# Patient Record
Sex: Male | Born: 2000 | Race: Black or African American | Hispanic: No | Marital: Single | Smoking: Never smoker
Health system: Southern US, Community
[De-identification: ages and names within clinical notes are randomized; demographics above are authoritative.]

## PROBLEM LIST (undated history)

## (undated) DIAGNOSIS — F319 Bipolar disorder, unspecified: Secondary | ICD-10-CM

## (undated) DIAGNOSIS — I10 Essential (primary) hypertension: Secondary | ICD-10-CM

## (undated) DIAGNOSIS — F909 Attention-deficit hyperactivity disorder, unspecified type: Secondary | ICD-10-CM

## (undated) DIAGNOSIS — E1165 Type 2 diabetes mellitus with hyperglycemia: Secondary | ICD-10-CM

## (undated) DIAGNOSIS — IMO0002 Reserved for concepts with insufficient information to code with codable children: Secondary | ICD-10-CM

## (undated) HISTORY — PX: TIBIA FRACTURE SURGERY: SHX806

## (undated) HISTORY — PX: EYE SURGERY: SHX253

---
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
## 2021-01-14 ENCOUNTER — Ambulatory Visit (HOSPITAL_COMMUNITY)
Admission: EM | Admit: 2021-01-14 | Discharge: 2021-01-14 | Disposition: A | Discharge status: Home / Self Care | Payer: Medicaid Other | Attending: Psychiatry | Admitting: Psychiatry

## 2021-01-14 DIAGNOSIS — Z91128 Patient's intentional underdosing of medication regimen for other reason: Secondary | ICD-10-CM | POA: Insufficient documentation

## 2021-01-14 DIAGNOSIS — F319 Bipolar disorder, unspecified: Secondary | ICD-10-CM | POA: Insufficient documentation

## 2021-01-14 DIAGNOSIS — Z9151 Personal history of suicidal behavior: Secondary | ICD-10-CM | POA: Insufficient documentation

## 2021-01-14 DIAGNOSIS — F209 Schizophrenia, unspecified: Secondary | ICD-10-CM | POA: Insufficient documentation

## 2021-01-14 DIAGNOSIS — R45851 Suicidal ideations: Secondary | ICD-10-CM

## 2021-01-14 NOTE — ED Provider Notes (Signed)
Behavioral Health Urgent Care Medical Screening Exam  Patient Name: Darryl [name] MRN: [id number] Date of Evaluation: 01/14/21 Chief Complaint:  SI Diagnosis:  Final diagnoses:  Suicidal ideation    History of Present illness: Darryl [name] is a 20 y.o. male patient who presents to the [facility] via [employer].  Patient seen and evaluated face-to-face by this provider, and chart reviewed. On evaluation, patient is alert and oriented x4. His thought process is logical and speech is clear and coherent. He appears casually dressed.  Patient reports that he called the police and told them he was suicidal after his friend kicked him out.  He states that his friend came and got him last night to spend the night in [place]. He states that this morning he asked his friend to take him back home to [place] and his friend said no. He states that they began arguing and his friend kicked him out. He states that he is not suicidal and that he needs a ride back home to [place]. He states that he resides with his uncle, aunt and cousin.   Patient denies suicidal ideations and verbally contracts for safety to return home. Patient denies homicidal ideations. Patient denies auditory or visual hallucinations. There is no objective evidence that the patient is responding to internal or external stimuli. Patient reports a history of schizophrenia and bipolar. He states he's been off his medications x 1 months. He is unable to recall the name of the medications he's prescribed. Patient reports that he follows up with psychiatry at [facility] in [facility] but is unable to name the practice. Patient denies drinking alcohol or using illicit drugs.Patient reports a hx of one suicide attempt 1 year ago in [place]. Patient reports a hx of 2 psych hospitalizations in 2021 and 2019.   Patient states that he does not know his uncle's number. He states that this provider can call his  father [name] [phone] for collateral. He states that his father is hearing impaired. Patient provided with a taxi voucher home.   Psychiatric Specialty Exam  Presentation  General Appearance:Appropriate for Environment  Eye Contact:Fair  Speech:Clear and Coherent  Speech Volume:Normal  Handedness:No data recorded  Mood and Affect  Mood:Euthymic  Affect:Congruent   Thought Process  Thought Processes:Coherent  Descriptions of Associations:Intact  Orientation:Full (Time, Place and Person)  Thought Content:Logical    Hallucinations:None  Ideas of Reference:None  Suicidal Thoughts:No  Homicidal Thoughts:No   Sensorium  Memory:Immediate Fair; Recent Fair; Remote Fair  Judgment:Intact  Insight:Fair   Executive Functions  Concentration:Fair  Attention Span:Fair  Recall:Fair  Fund of Knowledge:Fair  Language:Fair   Psychomotor Activity  Psychomotor Activity:Normal   Assets  Assets:Communication Skills; Desire for Improvement; Housing; Leisure Time; Physical Health; Social Support   Sleep  Sleep:Fair  Number of hours: 6   No data recorded  Physical Exam: Physical Exam Constitutional:      Appearance: Normal appearance.  HENT:     Head: Normocephalic and atraumatic.     Nose: Nose normal.  Eyes:     Conjunctiva/sclera: Conjunctivae normal.  Cardiovascular:     Rate and Rhythm: Normal rate.  Pulmonary:     Effort: Pulmonary effort is normal.  Musculoskeletal:        General: Normal range of motion.     Cervical back: Normal range of motion.  Neurological:     Mental Status: He is alert and oriented to person, place, and time.   Review of  Systems  Constitutional: Negative.   HENT: Negative.    Eyes: Negative.   Respiratory: Negative.    Cardiovascular: Negative.   Gastrointestinal: Negative.   Genitourinary: Negative.   Musculoskeletal: Negative.   Skin: Negative.   Neurological: Negative.    Endo/Heme/Allergies: Negative.   Blood pressure 132/82, pulse 65, temperature 98.4 F (36.9 C), temperature source Oral, resp. rate 16, SpO2 100 %. There is no height or weight on file to calculate BMI.  Musculoskeletal: Strength & Muscle Tone: within normal limits Gait & Station: normal Patient leans: N/A   [facility] MSE Discharge Disposition for Follow up and Recommendations: Based on my evaluation the patient does not appear to have an emergency medical condition and can be discharged with resources and follow up care in outpatient services for Medication Management, Individual Therapy, and Group Therapy  Safety:  The patient should abstain from use of illicit substances/drugs and abuse of any medications. If symptoms worsen or do not continue to improve or if the patient becomes actively suicidal or homicidal then it is recommended that the patient return to the closest hospital emergency department, the [facility], or call 911 for further evaluation and treatment. National Suicide Prevention Lifeline 1-800-SUICIDE or (340)888-0209.  About 988 988 offers 24/7 access to trained crisis counselors who can help people experiencing mental health-related distress. People can call or text 988 or chat 988lifeline.org for themselves or if they are worried about a loved one who may need crisis support.   Crisis Services:    Lorina Rabon [place] and surrounding areas [facility] [phone] or [phone] [facility] [phone] open 24/7 [facility] [address], [place], [place] [address] [phone]) [facility] Crisis (they come to you) 3042244448 [facility] Suicide Prevention Hotline 1.6236525839 Text HOME to (269)539-6903 988 IF YOU THINK YOU MAY HURT YOURSELF OR SOMEONE ELSE,  CALL 911 IMMEDIATELY  AND GO DIRECTLY TO THE NEAREST EMERGENCY ROOM  Layla Barter,  NP 01/14/2021, 9:49 AM

## 2021-01-14 NOTE — Discharge Instructions (Addendum)
Discharge recommendations:  Patient is to take medications as prescribed. Please see information for follow-up appointment with psychiatry and therapy. Please follow up with your primary care provider for all medical related needs.    Therapy: We recommend that patient participate in individual therapy to address mental health concerns.  Medications: The parent/guardian is to contact a medical professional and/or outpatient provider to address any new side effects that develop. Parent/guardian should update outpatient providers of any new medications and/or medication changes.   Atypical antipsychotics: If you are prescribed an atypical antipsychotic, it is recommended that your height, weight, BMI, blood pressure, fasting lipid panel, and fasting blood sugar be monitored by your outpatient providers.  Safety:  The patient should abstain from use of illicit substances/drugs and abuse of any medications. If symptoms worsen or do not continue to improve or if the patient becomes actively suicidal or homicidal then it is recommended that the patient return to the closest hospital emergency department, the [facility], or call 911 for further evaluation and treatment. National Suicide Prevention Lifeline 1-800-SUICIDE or 805-307-2364.  About 988 988 offers 24/7 access to trained crisis counselors who can help people experiencing mental health-related distress. People can call or text 988 or chat 988lifeline.org for themselves or if they are worried about a loved one who may need crisis support.    Crisis Services:    Lorina Rabon [place] and surrounding areas [facility] [phone] or [phone] [facility] [phone] open 24/7 [facility] [address], [place], [place] [address] [phone]) [facility] Crisis (they come to you) [phone] [facility] Suicide Prevention  Hotline 1.(724)557-9534 Text HOME to [phone] IF YOU THINK YOU MAY HURT YOURSELF OR SOMEONE ELSE,  CALL 911 IMMEDIATELY  AND GO DIRECTLY TO THE NEAREST EMERGENCY ROOM

## 2021-01-14 NOTE — Progress Notes (Signed)
CSW provided the following crisis resources for the patient to utilize in his local area:   Lorina Rabon [place] and surrounding areas [facility] [phone] or [phone] [facility] [phone] open 24/7 [facility] [address] [place], [place] [address] [phone]) [facility] Crisis (they come to you) [phone] [facility] Suicide Prevention Hotline 1.7147552598 Text HOME to ([phone] IF YOU THINK YOU MAY HURT YOURSELF OR SOMEONE ELSE,  CALL 911 IMMEDIATELY  AND GO DIRECTLY TO THE NEAREST EMERGENCY ROOM  Crissie Reese, MSW, LCSW-A, LCAS-A Phone: [phone] Disposition/TOC

## 2021-01-14 NOTE — BH Assessment (Signed)
Darryl [name], Urgent, MR #[id number]; 20 years old presents this date with [employer].  Pt reports SI without a direct plan.  Pt reports past suicide attempt with a plan to use a knife.  Pt denies HI or AVH.  Pt admits to prior schizophrenia and bipolar diagnoses. Pt reports he is not taken medication.  Pt signed MSE.

## 2021-01-14 NOTE — Progress Notes (Signed)
   01/14/21 0835  [facility] Triage Screening (Walk-ins at [facility] only)  How Did You Hear About [place]? Legal System  What Is the Reason for Your Visit/Call Today? SI  How Long Has This Been Causing You Problems? <Week  Have You Recently Had Any Thoughts About Hurting Yourself? Yes  How long ago did you have thoughts about hurting yourself? week  Are You Planning to Commit Suicide/Harm Yourself At This time? No  Have you Recently Had Thoughts About Hurting Someone Karolee Ohs? No  Are You Planning To Harm Someone At This Time? No  Are you currently experiencing any auditory, visual or other hallucinations? No  Have You Used Any Alcohol or Drugs in the Past 24 Hours? No  Do you have any current medical co-morbidities that require immediate attention? No  Clinician description of patient physical appearance/behavior: Cooperative  What Do You Feel Would Help You the Most Today? Treatment for Depression or other mood problem  If access to [facility] was not available, would you have sought care in the Emergency Department? Yes  Determination of Need Urgent (48 hours)  Options For Referral Medication Management;Outpatient Therapy

## 2021-01-22 ENCOUNTER — Telehealth (HOSPITAL_COMMUNITY): Payer: Self-pay | Admitting: Physician Assistant

## 2021-01-22 NOTE — BH Assessment (Signed)
Care Management - Follow Up [facility] Discharges   Writer made contact with the patient. Patient reports that he needs resource information for a mental health provider in [place].  Writer provided patient with resources for services at [facility] in [place].

## 2021-04-03 ENCOUNTER — Emergency Department (HOSPITAL_COMMUNITY)
Admission: EM | Admit: 2021-04-03 | Discharge: 2021-04-03 | Disposition: A | Discharge status: Home / Self Care | Payer: Medicaid Other | Attending: Emergency Medicine | Admitting: Emergency Medicine

## 2021-04-03 ENCOUNTER — Encounter (HOSPITAL_COMMUNITY): Payer: Self-pay

## 2021-04-03 ENCOUNTER — Ambulatory Visit (HOSPITAL_COMMUNITY)
Admission: EM | Admit: 2021-04-03 | Discharge: 2021-04-03 | Disposition: A | Discharge status: Home / Self Care | Payer: Medicaid Other | Source: Home / Self Care

## 2021-04-03 ENCOUNTER — Emergency Department (HOSPITAL_COMMUNITY): Payer: Medicaid Other

## 2021-04-03 DIAGNOSIS — Z794 Long term (current) use of insulin: Secondary | ICD-10-CM | POA: Insufficient documentation

## 2021-04-03 DIAGNOSIS — E109 Type 1 diabetes mellitus without complications: Secondary | ICD-10-CM | POA: Insufficient documentation

## 2021-04-03 DIAGNOSIS — M25571 Pain in right ankle and joints of right foot: Secondary | ICD-10-CM | POA: Insufficient documentation

## 2021-04-03 DIAGNOSIS — R0781 Pleurodynia: Secondary | ICD-10-CM | POA: Insufficient documentation

## 2021-04-03 DIAGNOSIS — Z634 Disappearance and death of family member: Secondary | ICD-10-CM | POA: Diagnosis not present

## 2021-04-03 DIAGNOSIS — Z5321 Procedure and treatment not carried out due to patient leaving prior to being seen by health care provider: Secondary | ICD-10-CM | POA: Insufficient documentation

## 2021-04-03 DIAGNOSIS — F3181 Bipolar II disorder: Secondary | ICD-10-CM | POA: Insufficient documentation

## 2021-04-03 NOTE — Discharge Instructions (Signed)
Patient is instructed prior to discharge to: Take all medications as prescribed by his/her mental healthcare provider. Report any adverse effects and or reactions from the medicines to his/her outpatient provider promptly. Patient has been instructed & cautioned: To not engage in alcohol and or illegal drug use while on prescription medicines. In the event of worsening symptoms, patient is instructed to call the crisis hotline, 911 and or go to the nearest ED for appropriate evaluation and treatment of symptoms. To follow-up with his/her primary care provider for your other medical issues, concerns and or health care needs.  The suicide prevention education provided includes the following: Suicide risk factors Suicide prevention and interventions National Suicide Hotline telephone number [facility] assessment telephone number [facility] Emergency Assistance [place] and/or Residential Mobile Crisis Unit telephone number

## 2021-04-03 NOTE — ED Provider Notes (Signed)
Behavioral Health Urgent Care Medical Screening Exam  Patient Name: Darryl [name] MRN: [id number] Date of Evaluation: 04/03/21 Chief Complaint:   Diagnosis:  Final diagnoses:  Bipolar 2 disorder (HCC)    History of Present illness: Darryl [name] is a 21 y.o. male patient presented to [facility] as a walk in seeking outpatient psychiatric resources for therapy and medication management.  Brooks Sailors, 21 y.o., male patient seen face to face by this provider, consulted with Dr. Bronwen Betters; and chart reviewed on 04/03/21.  Per chart review patient has a history of type 1 diabetes, SI, DMDD, ODD, GAD and bipolar 2. He reports a history of 2 psychiatric hospitalizations related to SI.  Patient reports he was released from jail this morning after a 1.5 months stimulant.  He found out that his mother had died while he was in jail.  States he became overwhelmed and was trying to find his way back home to [place].  He called the police to bring him to [facility] for assessment.  He is also requesting transportation back to his home.  Reports he currently lives with his brother.  He denies substance use.  Reports the only medications he takes at this time are for his diabetes.  He reports compliance.  He has a PCP that manages his diabetes.  He used to be on medications for bipolar depression and reports they were helpful.  Patient is possibly interested in starting medications again.   Of note: Patient has presented to [facility] in the past requesting transportation back to [place].  Per chart review on 11/05/2020 [facility] at that time he stated his mother had passed away 2 weeks ago..  During evaluation [name] is in sitting position.  He is alert/oriented x4, calm/cooperative.  He is fairly groomed and makes good eye contact.  His speech is at a moderate volume and normal pace.  He endorses depression related to his mother's death.  He denies any concerns  with sleep or appetite.  Objectively he does not appear to be responding to internal/external stimuli.  He denies AVH/HI/SI.  He easily contracts for safety.  Denies any access to firearms/weapons.   Outpatient psychiatric resources were provided.  Patient was provided with a [employer] and part bus pass.  At this time Gillian Kluever is educated and verbalizes understanding of mental health resources and other crisis services in the community.  He is instructed to call 911 and present to the nearest emergency room should he experience any suicidal/homicidal ideation, auditory/visual/hallucinations, or detrimental worsening of his mental health condition.  He was a also advised by Clinical research associate that he could call the toll-free phone on insurance card to assist with identifying in network counselors and agencies or number on back of Medicaid card t speak with care coordinator  Psychiatric Specialty Exam  Presentation  General Appearance:Appropriate for Environment; Casual  Eye Contact:Good  Speech:Clear and Coherent; Normal Rate  Speech Volume:Normal  Handedness:Right   Mood and Affect  Mood:Depressed  Affect:Congruent   Thought Process  Thought Processes:Coherent  Descriptions of Associations:Intact  Orientation:Full (Time, Place and Person)  Thought Content:Logical    Hallucinations:None  Ideas of Reference:None  Suicidal Thoughts:No  Homicidal Thoughts:No   Sensorium  Memory:Immediate Good; Remote Good; Recent Good  Judgment:Good  Insight:Good   Executive Functions  Concentration:Good  Attention Span:Good  Recall:Good  Fund of Knowledge:Good  Language:Fair   Psychomotor Activity  Psychomotor Activity:Normal   Assets  Assets:Communication Skills; Desire for Improvement; Housing;  Leisure Time; Physical Health; Social Support   Sleep  Sleep:Fair  Number of hours: 6   No data recorded  Physical Exam: Physical Exam Vitals and nursing note reviewed.   Constitutional:      General: He is not in acute distress.    Appearance: Normal appearance. He is well-developed.  HENT:     Head: Normocephalic and atraumatic.  Eyes:     General:        Right eye: No discharge.        Left eye: No discharge.     Conjunctiva/sclera: Conjunctivae normal.  Cardiovascular:     Rate and Rhythm: Normal rate.  Pulmonary:     Effort: Pulmonary effort is normal. No respiratory distress.  Musculoskeletal:        General: Normal range of motion.     Cervical back: Normal range of motion.  Skin:    Coloration: Skin is not jaundiced or pale.  Neurological:     Mental Status: He is alert and oriented to person, place, and time.  Psychiatric:        Attention and Perception: Attention and perception normal.        Mood and Affect: Mood is depressed.        Speech: Speech normal.        Behavior: Behavior normal. Behavior is cooperative.        Thought Content: Thought content normal.        Cognition and Memory: Cognition normal.        Judgment: Judgment normal.   Review of Systems  Constitutional: Negative.   HENT: Negative.    Eyes: Negative.   Respiratory: Negative.    Cardiovascular: Negative.   Musculoskeletal: Negative.   Skin: Negative.   Neurological: Negative.   Psychiatric/Behavioral:  Positive for depression.   Blood pressure (!) 134/92, pulse 74, temperature 98.2 F (36.8 C), temperature source Oral, resp. rate 18, SpO2 100 %. There is no height or weight on file to calculate BMI.  Musculoskeletal: Strength & Muscle Tone: within normal limits Gait & Station: normal Patient leans: N/A   BHUC MSE Discharge Disposition for Follow up and Recommendations: Based on my evaluation the patient does not appear to have an emergency medical condition and can be discharged with resources and follow up care in outpatient services for Medication Management and Individual Therapy  Discharge patient.  Patient was provided with outpatient  psychiatric resources for medication management and therapy.  He was also provided with a [place] and part bus pass.  No evidence of imminent risk to self or others at present.    Patient does not meet criteria for psychiatric inpatient admission. Discussed crisis plan, support from social network, calling 911, coming to the Emergency Department, and calling Suicide Hotline.    Ardis Hughs, NP 04/03/2021, 9:56 PM

## 2021-04-03 NOTE — ED Provider Triage Note (Signed)
Emergency Medicine Provider Triage Evaluation Note  Darryl [name] , a 21 y.o. male  was evaluated in triage.  Pt complains of right flank/rib pain x3 days since getting out of jail.  Also right ankle pain.  States he has been sleeping on floors and thinks that is why he is hurting.  Denies urinary changes  Review of Systems  Positive: Rib and ankle pain Negative: fever  Physical Exam  BP 139/77 (BP Location: Left Arm)    Pulse 86    Temp 98.1 F (36.7 C) (Oral)    Resp 18    SpO2 100%  Gen:   Awake, no distress   Resp:  Normal effort  MSK:   Moves extremities without difficulty  Other:  Endorses pain along right lateral ribs and right ankle, ambulatory, no deformities noted  Medical Decision Making  Medically screening exam initiated at 2:26 AM.  Appropriate orders placed.  Darryl [name] was informed that the remainder of the evaluation will be completed by another provider, this initial triage assessment does not replace that evaluation, and the importance of remaining in the ED until their evaluation is complete.  Right rib and ankle pain.  Has been sleeping on the floor.  Recently incarcerated.  Will obtain x-rays.   Darryl Pickett, PA-C 04/03/21 0231

## 2021-04-03 NOTE — BH Assessment (Addendum)
Darryl [name] is routine. Denies SI, HI, AVH. PT reports he was released from jail this morning and found out that his mother died while he was in jail. Pt reports having a "mental breakdown" due to not having a way back home, dealing with his mother death and feeling stranded so he called the police to come here so he can talk about his issues and get outpatient resources. Pt also needs a way back home. PART voucher provided to pt upon discharge.

## 2021-04-03 NOTE — ED Notes (Signed)
Pt discharged with  AVS.  AVS reviewed prior to discharge.  Pt alert, oriented, and ambulatory.  Safety maintained.  °

## 2021-04-03 NOTE — ED Triage Notes (Signed)
Pt comes via [facility] for R flank pain for the past 3 days since getting out of jail due to sleeping on the ground

## 2022-03-19 ENCOUNTER — Encounter (HOSPITAL_COMMUNITY): Payer: Self-pay

## 2022-03-19 ENCOUNTER — Other Ambulatory Visit: Payer: Self-pay

## 2022-03-19 ENCOUNTER — Emergency Department (HOSPITAL_COMMUNITY)
Admission: EM | Admit: 2022-03-19 | Discharge: 2022-03-19 | Disposition: A | Discharge status: Home / Self Care | Payer: Medicaid Other | Attending: Emergency Medicine | Admitting: Emergency Medicine

## 2022-03-19 ENCOUNTER — Emergency Department (HOSPITAL_COMMUNITY): Payer: Medicaid Other

## 2022-03-19 DIAGNOSIS — R072 Precordial pain: Secondary | ICD-10-CM | POA: Diagnosis present

## 2022-03-19 LAB — CBC WITH DIFFERENTIAL/PLATELET
Abs Immature Granulocytes: 0.01 10*3/uL (ref 0.00–0.07)
Basophils Absolute: 0 10*3/uL (ref 0.0–0.1)
Basophils Relative: 0 %
Eosinophils Absolute: 0.1 10*3/uL (ref 0.0–0.5)
Eosinophils Relative: 2 %
HCT: 37.1 % — ABNORMAL LOW (ref 39.0–52.0)
Hemoglobin: 12.7 g/dL — ABNORMAL LOW (ref 13.0–17.0)
Immature Granulocytes: 0 %
Lymphocytes Relative: 37 %
Lymphs Abs: 2.3 10*3/uL (ref 0.7–4.0)
MCH: 25.5 pg — ABNORMAL LOW (ref 26.0–34.0)
MCHC: 34.2 g/dL (ref 30.0–36.0)
MCV: 74.5 fL — ABNORMAL LOW (ref 80.0–100.0)
Monocytes Absolute: 0.4 10*3/uL (ref 0.1–1.0)
Monocytes Relative: 6 %
Neutro Abs: 3.4 10*3/uL (ref 1.7–7.7)
Neutrophils Relative %: 55 %
Platelets: 269 10*3/uL (ref 150–400)
RBC: 4.98 MIL/uL (ref 4.22–5.81)
RDW: 14.7 % (ref 11.5–15.5)
WBC: 6.1 10*3/uL (ref 4.0–10.5)
nRBC: 0 % (ref 0.0–0.2)

## 2022-03-19 LAB — I-STAT CHEM 8, ED
BUN: 14 mg/dL (ref 6–20)
Calcium, Ion: 1.18 mmol/L (ref 1.15–1.40)
Chloride: 103 mmol/L (ref 98–111)
Creatinine, Ser: 0.7 mg/dL (ref 0.61–1.24)
Glucose, Bld: 153 mg/dL — ABNORMAL HIGH (ref 70–99)
HCT: 38 % — ABNORMAL LOW (ref 39.0–52.0)
Hemoglobin: 12.9 g/dL — ABNORMAL LOW (ref 13.0–17.0)
Potassium: 4.1 mmol/L (ref 3.5–5.1)
Sodium: 141 mmol/L (ref 135–145)
TCO2: 26 mmol/L (ref 22–32)

## 2022-03-19 LAB — TROPONIN I (HIGH SENSITIVITY)
Troponin I (High Sensitivity): 4 ng/L (ref <18)
Troponin I (High Sensitivity): 4 ng/L (ref <18)

## 2022-03-19 MED ORDER — IOHEXOL 350 MG/ML SOLN
75.0000 mL | Freq: Once | INTRAVENOUS | Status: AC | PRN
  Administered 2022-03-19: 75 mL via INTRAVENOUS

## 2022-03-19 MED ORDER — KETOROLAC TROMETHAMINE 30 MG/ML IJ SOLN
30.0000 mg | Freq: Once | INTRAMUSCULAR | Status: AC
  Administered 2022-03-19: 30 mg via INTRAVENOUS
  Filled 2022-03-19: qty 1

## 2022-03-19 NOTE — ED Triage Notes (Signed)
Pt from jail c/o chest pain x 2 days Denies cough/fever

## 2022-03-19 NOTE — ED Provider Notes (Signed)
[facility] Provider Note   CSN: [id number] Arrival date & time: 03/19/22  0210     History  Chief Complaint  Patient presents with   Chest Pain    Darryl [name] is a 22 y.o. male.  The history is provided by the patient.  Chest Pain Pain location:  Substernal area Pain quality: sharp   Pain radiates to:  Does not radiate Pain severity:  Moderate Onset quality:  Gradual Duration:  2 days Progression:  Waxing and waning Chronicity:  New Context: at rest   Context comment:  Worse since discharged from 2 hours ago Relieved by:  Nothing Worsened by:  Nothing Ineffective treatments:  None tried Associated symptoms: no altered mental status, no back pain, no cough, no fatigue, no fever, no heartburn, no palpitations, no PND, no shortness of breath, no vomiting and no weakness   Risk factors: male sex   Risk factors: no prior DVT/PE and no smoking        Home Medications Prior to Admission medications   Medication Sig Start Date End Date Taking? Authorizing Provider  albuterol (VENTOLIN HFA) 108 (90 Base) MCG/ACT inhaler Inhale 1-2 puffs into the lungs every 6 (six) hours as needed for wheezing or shortness of breath.    [provider]  Continuous Blood Gluc Sensor (DEXCOM G6 SENSOR) MISC 1 each by Does not apply route as directed. 1 sensor every 10 days 07/01/19   Lelon Huh, MD  Continuous Blood Gluc Transmit (DEXCOM G6 TRANSMITTER) MISC 1 each by Does not apply route every 3 (three) months. 07/01/19   Lelon Huh, MD  glyBURIDE-metformin (GLUCOVANCE) 5-500 MG tablet Take 1 tablet by mouth 2 (two) times daily with a meal. 08/13/19   Anthoney Harada, NP  insulin NPH-regular Human (70-30) 100 UNIT/ML injection Inject 5 Units into the skin daily as needed (sugar level per pt).  03/16/19   [provider]  glyBURIDE (DIABETA) 5 MG tablet Take 1 tablet (5 mg total) by mouth 2 (two) times daily with a meal for 5  days. 07/02/19 07/09/19  Samule Ohm I, MD  metFORMIN (GLUCOPHAGE) 500 MG tablet Take 1 tablet (500 mg total) by mouth 2 (two) times daily with a meal for 5 days. Patient not taking: Reported on 07/07/2019 07/02/19 07/09/19  Samule Ohm I, MD      Allergies    Patient has no known allergies.    Review of Systems   Review of Systems  Constitutional:  Negative for fatigue and fever.  HENT:  Negative for facial swelling.   Respiratory:  Negative for cough and shortness of breath.   Cardiovascular:  Positive for chest pain. Negative for palpitations and PND.  Gastrointestinal:  Negative for heartburn and vomiting.  Musculoskeletal:  Negative for back pain.  Neurological:  Negative for weakness.  All other systems reviewed and are negative.   Physical Exam Updated Vital Signs BP (!) 140/82   Pulse 89   Temp 98.7 F (37.1 C) (Oral)   Resp 16   Ht 6' 2"$  (1.88 m)   Wt 86.6 kg   SpO2 100%   BMI 24.52 kg/m  Physical Exam Constitutional:      General: He is not in acute distress.    Appearance: Normal appearance. He is well-developed. He is not diaphoretic.  HENT:     Head: Normocephalic and atraumatic.     Nose: Nose normal.  Eyes:     Conjunctiva/sclera: Conjunctivae normal.  Pupils: Pupils are equal, round, and reactive to light.  Cardiovascular:     Rate and Rhythm: Normal rate and regular rhythm.     Pulses: Normal pulses.     Heart sounds: Normal heart sounds.  Pulmonary:     Effort: Pulmonary effort is normal.     Breath sounds: Normal breath sounds. No wheezing or rales.  Abdominal:     General: Bowel sounds are normal.     Palpations: Abdomen is soft.     Tenderness: There is no abdominal tenderness. There is no guarding or rebound.  Musculoskeletal:        General: Normal range of motion.     Cervical back: Normal range of motion and neck supple.     Right lower leg: No edema.     Left lower leg: No edema.  Skin:    General: Skin is warm and dry.     Capillary  Refill: Capillary refill takes less than 2 seconds.  Neurological:     General: No focal deficit present.     Mental Status: He is alert and oriented to person, place, and time.     Deep Tendon Reflexes: Reflexes normal.  Psychiatric:        Mood and Affect: Mood normal.        Behavior: Behavior normal.     ED Results / Procedures / Treatments   Labs (all labs ordered are listed, but only abnormal results are displayed) Results for orders placed or performed during the hospital encounter of 03/19/22  CBC with Differential  Result Value Ref Range   WBC 6.1 4.0 - 10.5 K/uL   RBC 4.98 4.22 - 5.81 MIL/uL   Hemoglobin 12.7 (L) 13.0 - 17.0 g/dL   HCT 37.1 (L) 39.0 - 52.0 %   MCV 74.5 (L) 80.0 - 100.0 fL   MCH 25.5 (L) 26.0 - 34.0 pg   MCHC 34.2 30.0 - 36.0 g/dL   RDW 14.7 11.5 - 15.5 %   Platelets 269 150 - 400 K/uL   nRBC 0.0 0.0 - 0.2 %   Neutrophils Relative % 55 %   Neutro Abs 3.4 1.7 - 7.7 K/uL   Lymphocytes Relative 37 %   Lymphs Abs 2.3 0.7 - 4.0 K/uL   Monocytes Relative 6 %   Monocytes Absolute 0.4 0.1 - 1.0 K/uL   Eosinophils Relative 2 %   Eosinophils Absolute 0.1 0.0 - 0.5 K/uL   Basophils Relative 0 %   Basophils Absolute 0.0 0.0 - 0.1 K/uL   Immature Granulocytes 0 %   Abs Immature Granulocytes 0.01 0.00 - 0.07 K/uL  I-stat chem 8, ED (not at [facility], [facility] or [facility])  Result Value Ref Range   Sodium 141 135 - 145 mmol/L   Potassium 4.1 3.5 - 5.1 mmol/L   Chloride 103 98 - 111 mmol/L   BUN 14 6 - 20 mg/dL   Creatinine, Ser 0.70 0.61 - 1.24 mg/dL   Glucose, Bld 153 (H) 70 - 99 mg/dL   Calcium, Ion 1.18 1.15 - 1.40 mmol/L   TCO2 26 22 - 32 mmol/L   Hemoglobin 12.9 (L) 13.0 - 17.0 g/dL   HCT 38.0 (L) 39.0 - 52.0 %  Troponin I (High Sensitivity)  Result Value Ref Range   Troponin I (High Sensitivity) 4 <18 ng/L  Troponin I (High Sensitivity)  Result Value Ref Range   Troponin I (High Sensitivity) 4 <18 ng/L   CT Angio Chest PE W and/or Wo Contrast  Result  Date: 03/19/2022 CLINICAL DATA:  Chronic dyspnea, chest wall or pleural disease suspected. Chest pain. EXAM: CT ANGIOGRAPHY CHEST WITH CONTRAST TECHNIQUE: Multidetector CT imaging of the chest was performed using the standard protocol during bolus administration of intravenous contrast. Multiplanar CT image reconstructions and MIPs were obtained to evaluate the vascular anatomy. RADIATION DOSE REDUCTION: This exam was performed according to the departmental dose-optimization program which includes automated exposure control, adjustment of the mA and/or kV according to patient size and/or use of iterative reconstruction technique. CONTRAST:  53m OMNIPAQUE IOHEXOL 350 MG/ML SOLN COMPARISON:  None Available. FINDINGS: Cardiovascular: The heart is normal in size and there is no pericardial effusion. The aorta and pulmonary trunk are normal in caliber. No pulmonary artery filling defect is seen. Mediastinum/Nodes: No enlarged mediastinal, hilar, or axillary lymph nodes. Thyroid gland, trachea, and esophagus demonstrate no significant findings. Lungs/Pleura: Lungs are clear. No pleural effusion or pneumothorax. Upper Abdomen: No acute abnormality. Musculoskeletal: No acute osseous abnormality. Review of the MIP images confirms the above findings. IMPRESSION: No evidence of pulmonary embolism or other acute process. Electronically Signed   By: LBrett FairyM.D.   On: 03/19/2022 03:26     EKG In muse   Radiology CT Angio Chest PE W and/or Wo Contrast  Result Date: 03/19/2022 CLINICAL DATA:  Chronic dyspnea, chest wall or pleural disease suspected. Chest pain. EXAM: CT ANGIOGRAPHY CHEST WITH CONTRAST TECHNIQUE: Multidetector CT imaging of the chest was performed using the standard protocol during bolus administration of intravenous contrast. Multiplanar CT image reconstructions and MIPs were obtained to evaluate the vascular anatomy. RADIATION DOSE REDUCTION: This exam was performed according to the departmental  dose-optimization program which includes automated exposure control, adjustment of the mA and/or kV according to patient size and/or use of iterative reconstruction technique. CONTRAST:  752mOMNIPAQUE IOHEXOL 350 MG/ML SOLN COMPARISON:  None Available. FINDINGS: Cardiovascular: The heart is normal in size and there is no pericardial effusion. The aorta and pulmonary trunk are normal in caliber. No pulmonary artery filling defect is seen. Mediastinum/Nodes: No enlarged mediastinal, hilar, or axillary lymph nodes. Thyroid gland, trachea, and esophagus demonstrate no significant findings. Lungs/Pleura: Lungs are clear. No pleural effusion or pneumothorax. Upper Abdomen: No acute abnormality. Musculoskeletal: No acute osseous abnormality. Review of the MIP images confirms the above findings. IMPRESSION: No evidence of pulmonary embolism or other acute process. Electronically Signed   By: LaBrett Fairy.D.   On: 03/19/2022 03:26    Procedures Procedures    Medications Ordered in ED Medications  iohexol (OMNIPAQUE) 350 MG/ML injection 75 mL (75 mLs Intravenous Contrast Given 03/19/22 0315)  ketorolac (TORADOL) 30 MG/ML injection 30 mg (30 mg Intravenous Given 03/19/22 0418)    ED Course/ Medical Decision Making/ A&P                             Medical Decision Making Patient with sharp chest pain for 2 days worse with inspiration worse since [facility] from jail   Amount and/or Complexity of Data Reviewed External Data Reviewed: notes.    Details: Previous notes reviewed  Labs: ordered.    Details: All labs reviewed: 2 negative troponins 4, normal white count 6.1, hemoglobin slight low 12.7, normal platelet count. Normal sodium 141, normal potassium 4.1 normal creatinine .7  Radiology: ordered and independent interpretation performed.    Details: Normal CTA ECG/medicine tests: ordered and independent interpretation performed.    Details: No ST T changes  Risk Prescription drug  management. Risk Details: Ruled out for MI and PE in the ED.  HEART score is 0 very low risk for MACE.  Symptoms consistent with MSK.  Stable for discharge.  Strict return     Final Clinical Impression(s) / ED Diagnoses Final diagnoses:  Precordial pain   Return for intractable cough, coughing up blood, fevers > 100.4 unrelieved by medication, shortness of breath, intractable vomiting, chest pain, shortness of breath, weakness, numbness, changes in speech, facial asymmetry, abdominal pain, passing out, Inability to tolerate liquids or food, cough, altered mental status or any concerns. No signs of systemic illness or infection. The patient is nontoxic-appearing on exam and vital signs are within normal limits.  I have reviewed the triage vital signs and the nursing notes. Pertinent labs & imaging results that were available during my care of the patient were reviewed by me and considered in my medical decision making (see chart for details). After history, exam, and medical workup I feel the patient has been appropriately medically screened and is safe for discharge home. Pertinent diagnoses were discussed with the patient. Patient was given return precautions.   Rx / DC Orders ED Discharge Orders     None         Emani Morad, MD 03/19/22 [phone]

## 2022-05-03 ENCOUNTER — Emergency Department (HOSPITAL_COMMUNITY): Payer: Medicaid Other

## 2022-05-03 ENCOUNTER — Other Ambulatory Visit: Payer: Self-pay

## 2022-05-03 ENCOUNTER — Emergency Department (HOSPITAL_COMMUNITY)
Admission: EM | Admit: 2022-05-03 | Discharge: 2022-05-03 | Discharge status: Against Medical Advice | Payer: Medicaid Other | Attending: Emergency Medicine | Admitting: Emergency Medicine

## 2022-05-03 DIAGNOSIS — Z5321 Procedure and treatment not carried out due to patient leaving prior to being seen by health care provider: Secondary | ICD-10-CM | POA: Insufficient documentation

## 2022-05-03 DIAGNOSIS — R079 Chest pain, unspecified: Secondary | ICD-10-CM | POA: Insufficient documentation

## 2022-05-03 DIAGNOSIS — R739 Hyperglycemia, unspecified: Secondary | ICD-10-CM | POA: Diagnosis present

## 2022-05-03 LAB — BASIC METABOLIC PANEL
Anion gap: 14 (ref 5–15)
BUN: 8 mg/dL (ref 6–20)
CO2: 22 mmol/L (ref 22–32)
Calcium: 9.7 mg/dL (ref 8.9–10.3)
Chloride: 96 mmol/L — ABNORMAL LOW (ref 98–111)
Creatinine, Ser: 0.88 mg/dL (ref 0.61–1.24)
GFR, Estimated: >60 mL/min (ref >60)
Glucose, Bld: 532 mg/dL (ref 70–99)
Potassium: 4.1 mmol/L (ref 3.5–5.1)
Sodium: 132 mmol/L — ABNORMAL LOW (ref 135–145)

## 2022-05-03 LAB — CBC
HCT: 42.2 % (ref 39.0–52.0)
Hemoglobin: 14.5 g/dL (ref 13.0–17.0)
MCH: 25.7 pg — ABNORMAL LOW (ref 26.0–34.0)
MCHC: 34.4 g/dL (ref 30.0–36.0)
MCV: 74.7 fL — ABNORMAL LOW (ref 80.0–100.0)
Platelets: 252 K/uL (ref 150–400)
RBC: 5.65 MIL/uL (ref 4.22–5.81)
RDW: 14.4 % (ref 11.5–15.5)
WBC: 6.1 K/uL (ref 4.0–10.5)
nRBC: 0 % (ref 0.0–0.2)

## 2022-05-03 LAB — TROPONIN I (HIGH SENSITIVITY)
Troponin I (High Sensitivity): 5 ng/L
Troponin I (High Sensitivity): 9 ng/L (ref <18)

## 2022-05-03 LAB — CBG MONITORING, ED: Glucose-Capillary: 552 mg/dL (ref 70–99)

## 2022-05-03 MED ORDER — INSULIN ASPART 100 UNIT/ML IJ SOLN
10.0000 [IU] | Freq: Once | INTRAMUSCULAR | Status: AC
  Administered 2022-05-03: 10 [IU] via SUBCUTANEOUS

## 2022-05-03 MED ORDER — LACTATED RINGERS IV BOLUS
1000.0000 mL | Freq: Once | INTRAVENOUS | Status: AC
  Administered 2022-05-03: 1000 mL via INTRAVENOUS

## 2022-05-03 NOTE — ED Notes (Signed)
Triage PA provider notified on patient's hyperglycemia.

## 2022-05-03 NOTE — ED Notes (Signed)
Patient disconnected his IV fluids, left them running on the floor and walked away from stretcher. When found and questioned about what had happened patient stated he was leaving. Patient collected his belongings and proceeded toward the lobby. After following patient and explaining why he should stay, including risks and benefits, patient refused to stay but agreed to have his IV removed.

## 2022-05-03 NOTE — ED Triage Notes (Signed)
Patient reports left chest pain for 2 days , denies SOB , no emesis or diaphoresis .

## 2022-05-03 NOTE — ED Provider Triage Note (Signed)
Emergency Medicine Provider Triage Evaluation Note  Darryl [name] , a 22 y.o. male  was evaluated in triage.  Pt complains of central chest pain, described as dull, 8 out of 10 in severity which been ongoing for 2 days.  Patient denies shortness of breath at this time.  States the pain overall has been intermittent for the past 2 weeks.  He states he has a history of a heart murmur.  Denies nausea, vomiting.  Review of Systems  Positive: As above Negative: As above  Physical Exam  BP (!) 163/108 (BP Location: Right Arm)   Pulse (!) 122   Temp 98.6 F (37 C) (Oral)   Resp 18   SpO2 100%  Gen:   Awake, no distress   Resp:  Normal effort  MSK:   Moves extremities without difficulty  Other:    Medical Decision Making  Medically screening exam initiated at 12:28 AM.  Appropriate orders placed.  Darryl [name] was informed that the remainder of the evaluation will be completed by another provider, this initial triage assessment does not replace that evaluation, and the importance of remaining in the ED until their evaluation is complete.     Dorothyann Peng, [place] 05/03/22 [phone]

## 2022-05-13 ENCOUNTER — Ambulatory Visit (HOSPITAL_COMMUNITY)
Admission: EM | Admit: 2022-05-13 | Discharge: 2022-05-13 | Disposition: A | Discharge status: Home / Self Care | Payer: Medicaid Other | Attending: Internal Medicine | Admitting: Internal Medicine

## 2022-05-13 ENCOUNTER — Encounter (HOSPITAL_COMMUNITY): Payer: Self-pay

## 2022-05-13 DIAGNOSIS — Z113 Encounter for screening for infections with a predominantly sexual mode of transmission: Secondary | ICD-10-CM | POA: Diagnosis present

## 2022-05-13 NOTE — ED Triage Notes (Signed)
Patient requesting STD testing. Patient denies any symptoms.  Patient states he and his partner are wanting to make sure everything is OK

## 2022-05-13 NOTE — Discharge Instructions (Addendum)
Please abstain from sexual intercourse until lab results are available We will call you if lab results are abnormal If you have any other concerns please return to urgent care to be evaluated for other

## 2022-05-14 LAB — CYTOLOGY, (ORAL, ANAL, URETHRAL) ANCILLARY ONLY
Chlamydia: NEGATIVE
Comment: NEGATIVE
Comment: NEGATIVE
Comment: NORMAL
Neisseria Gonorrhea: NEGATIVE
Trichomonas: NEGATIVE

## 2022-05-14 NOTE — ED Provider Notes (Signed)
[facility]    CSN: [id number] Arrival date & time: 05/13/22  1341      History   Chief Complaint No chief complaint on file.   HPI Darryl [name] is a 22 y.o. male comes to the urgent care for STD testing.  Patient has no symptoms.   HPI  Past Medical History:  Diagnosis Date   ADHD    Bipolar affective    Diabetes type 2, uncontrolled    Hypertension     Patient Active Problem List   Diagnosis Date Noted   Bipolar disorder (HCC) 07/04/2019   Attention deficit hyperactivity disorder (ADHD) 07/04/2019   Hyperglycemia 07/01/2019   Penile discharge     Past Surgical History:  Procedure Laterality Date   EYE SURGERY     TIBIA FRACTURE SURGERY Right        Home Medications    Prior to Admission medications   Medication Sig Start Date End Date Taking? Authorizing Provider  albuterol (VENTOLIN HFA) 108 (90 Base) MCG/ACT inhaler Inhale 1-2 puffs into the lungs every 6 (six) hours as needed for wheezing or shortness of breath.    [provider]  Continuous Blood Gluc Sensor (DEXCOM G6 SENSOR) MISC 1 each by Does not apply route as directed. 1 sensor every 10 days 07/01/19   Dessa Phi, MD  Continuous Blood Gluc Transmit (DEXCOM G6 TRANSMITTER) MISC 1 each by Does not apply route every 3 (three) months. 07/01/19   Dessa Phi, MD  glyBURIDE-metformin (GLUCOVANCE) 5-500 MG tablet Take 1 tablet by mouth 2 (two) times daily with a meal. 08/13/19   Orma Flaming, NP  insulin NPH-regular Human (70-30) 100 UNIT/ML injection Inject 5 Units into the skin daily as needed (sugar level per pt).  03/16/19   [provider]  glyBURIDE (DIABETA) 5 MG tablet Take 1 tablet (5 mg total) by mouth 2 (two) times daily with a meal for 5 days. 07/02/19 07/09/19  Collene Gobble I, MD  metFORMIN (GLUCOPHAGE) 500 MG tablet Take 1 tablet (500 mg total) by mouth 2 (two) times daily with a meal for 5 days. Patient not taking: Reported on 07/07/2019 07/02/19 07/09/19  Janalyn Harder, MD    Family History Family History  Family history unknown: Yes    Social History Social History   Tobacco Use   Smoking status: Never   Smokeless tobacco: Never  Vaping Use   Vaping Use: Every day   Substances: Nicotine, Flavoring  Substance Use Topics   Alcohol use: Never   Drug use: Never     Allergies   Patient has no known allergies.   Review of Systems Review of Systems  All other systems reviewed and are negative.    Physical Exam Triage Vital Signs ED Triage Vitals  Enc Vitals Group     BP 05/13/22 1412 (!) 161/86     Pulse Rate 05/13/22 1412 (!) 112     Resp 05/13/22 1412 16     Temp 05/13/22 1412 98.1 F (36.7 C)     Temp Source 05/13/22 1412 Oral     SpO2 05/13/22 1412 96 %     Weight --      Height --      Head Circumference --      Peak Flow --      Pain Score 05/13/22 1413 0     Pain Loc --      Pain Edu? --      Excl. in GC? --  No data found.  Updated Vital Signs BP (!) 161/86 (BP Location: Left Arm)   Pulse (!) 112   Temp 98.1 F (36.7 C) (Oral)   Resp 16   SpO2 96%   Visual Acuity Right Eye Distance:   Left Eye Distance:   Bilateral Distance:    Right Eye Near:   Left Eye Near:    Bilateral Near:     Physical Exam Vitals and nursing note reviewed.  Constitutional:      Appearance: Normal appearance.  Cardiovascular:     Rate and Rhythm: Normal rate and regular rhythm.     Pulses: Normal pulses.     Heart sounds: Normal heart sounds.  Pulmonary:     Effort: Pulmonary effort is normal.     Breath sounds: Normal breath sounds.  Abdominal:     Palpations: Abdomen is soft.  Neurological:     Mental Status: He is alert.      UC Treatments / Results  Labs (all labs ordered are listed, but only abnormal results are displayed) Labs Reviewed  CYTOLOGY, (ORAL, ANAL, URETHRAL) ANCILLARY ONLY    EKG   Radiology No results found.  Procedures Procedures (including critical care  time)  Medications Ordered in UC Medications - No data to display  Initial Impression / Assessment and Plan / UC Course  I have reviewed the triage vital signs and the nursing notes.  Pertinent labs & imaging results that were available during my care of the patient were reviewed by me and considered in my medical decision making (see chart for details).     1.  STD screening: Cytology for GC/chlamydia/trichomonas Patient will be called with recommendations if labs are abnormal Safe sex practices advised Abstinence recommended until lab results are available. Final Clinical Impressions(s) / UC Diagnoses   Final diagnoses:  Screening for STD (sexually transmitted disease)     Discharge Instructions      Please abstain from sexual intercourse until lab results are available We will call you if lab results are abnormal If you have any other concerns please return to urgent care to be evaluated for other   ED Prescriptions   None    PDMP not reviewed this encounter.   Merrilee Jansky, MD 05/14/22 [phone]

## 2023-08-06 IMAGING — CR DG ANKLE COMPLETE 3+V*R*
3 series · 3 of 3 positions shown · non-contrast
Comparison: None.

CLINICAL DATA: Right ankle pain.

EXAM:
RIGHT ANKLE - COMPLETE 3+ VIEW

[ankle ap]
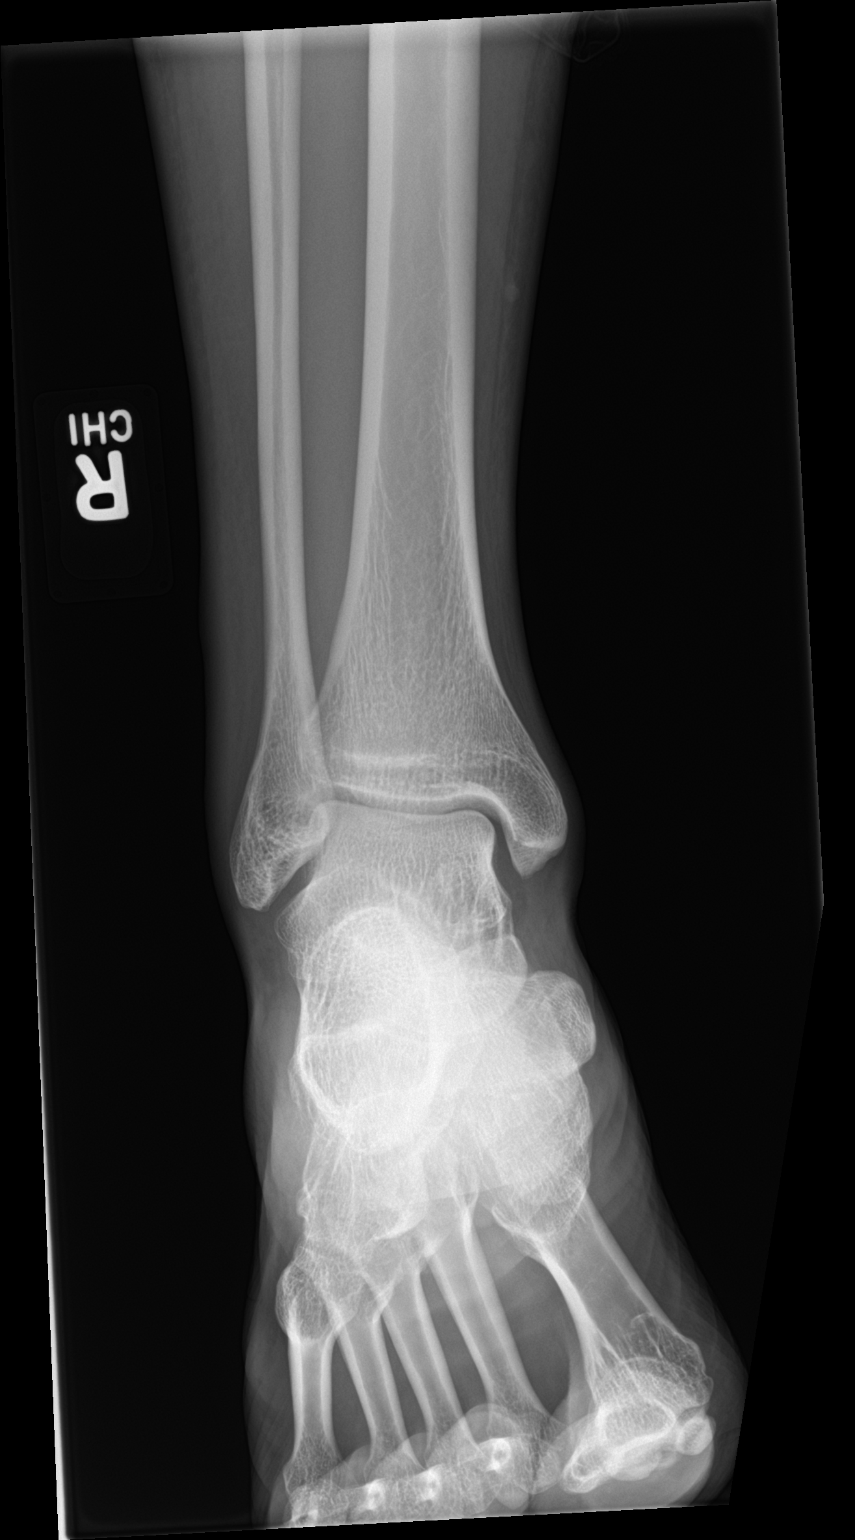

[ankle obl]
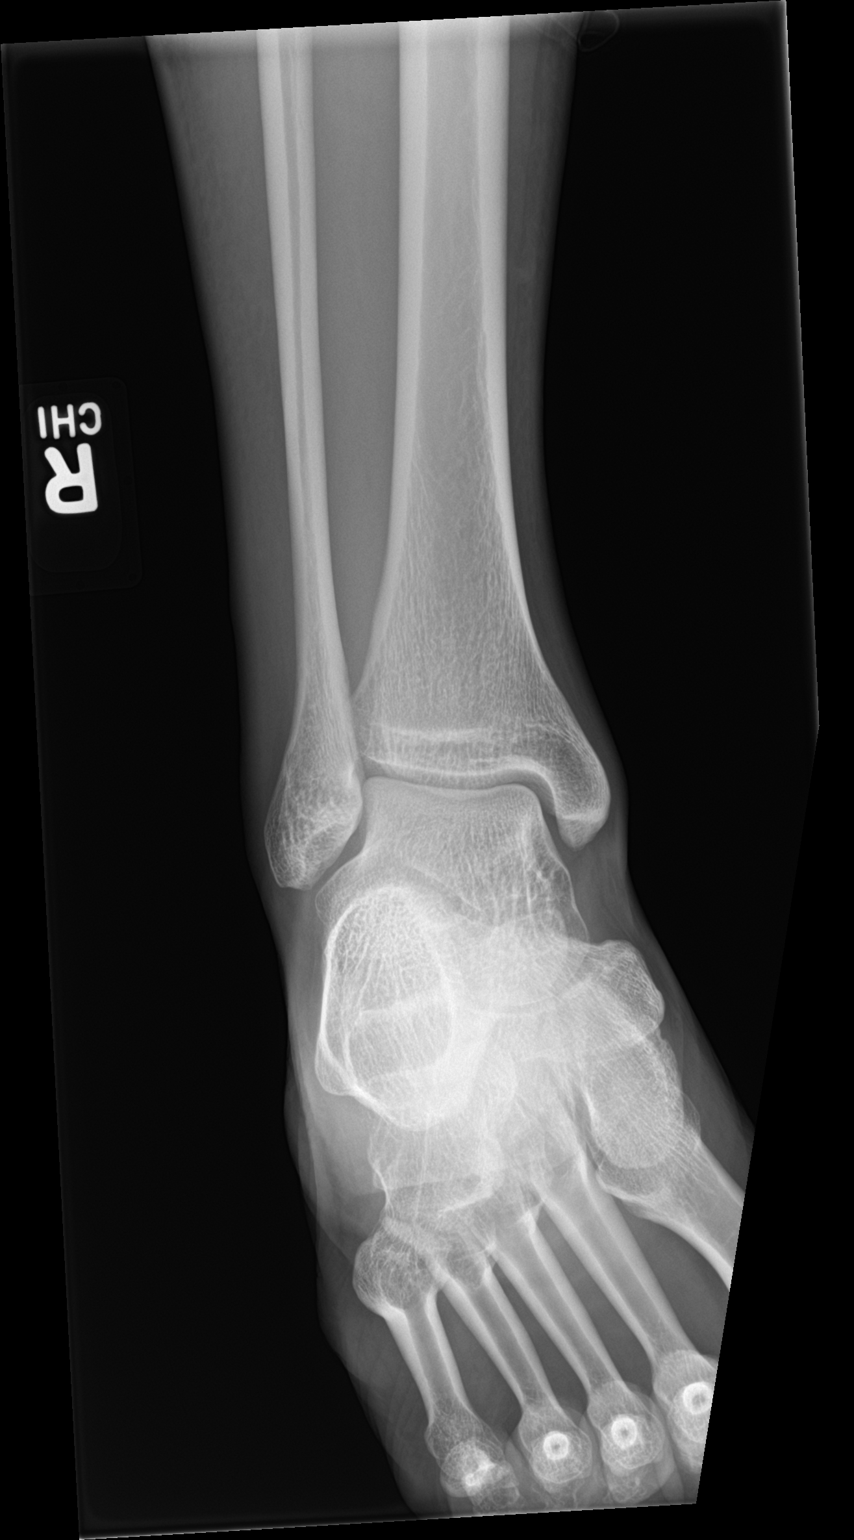

[ankle lat]
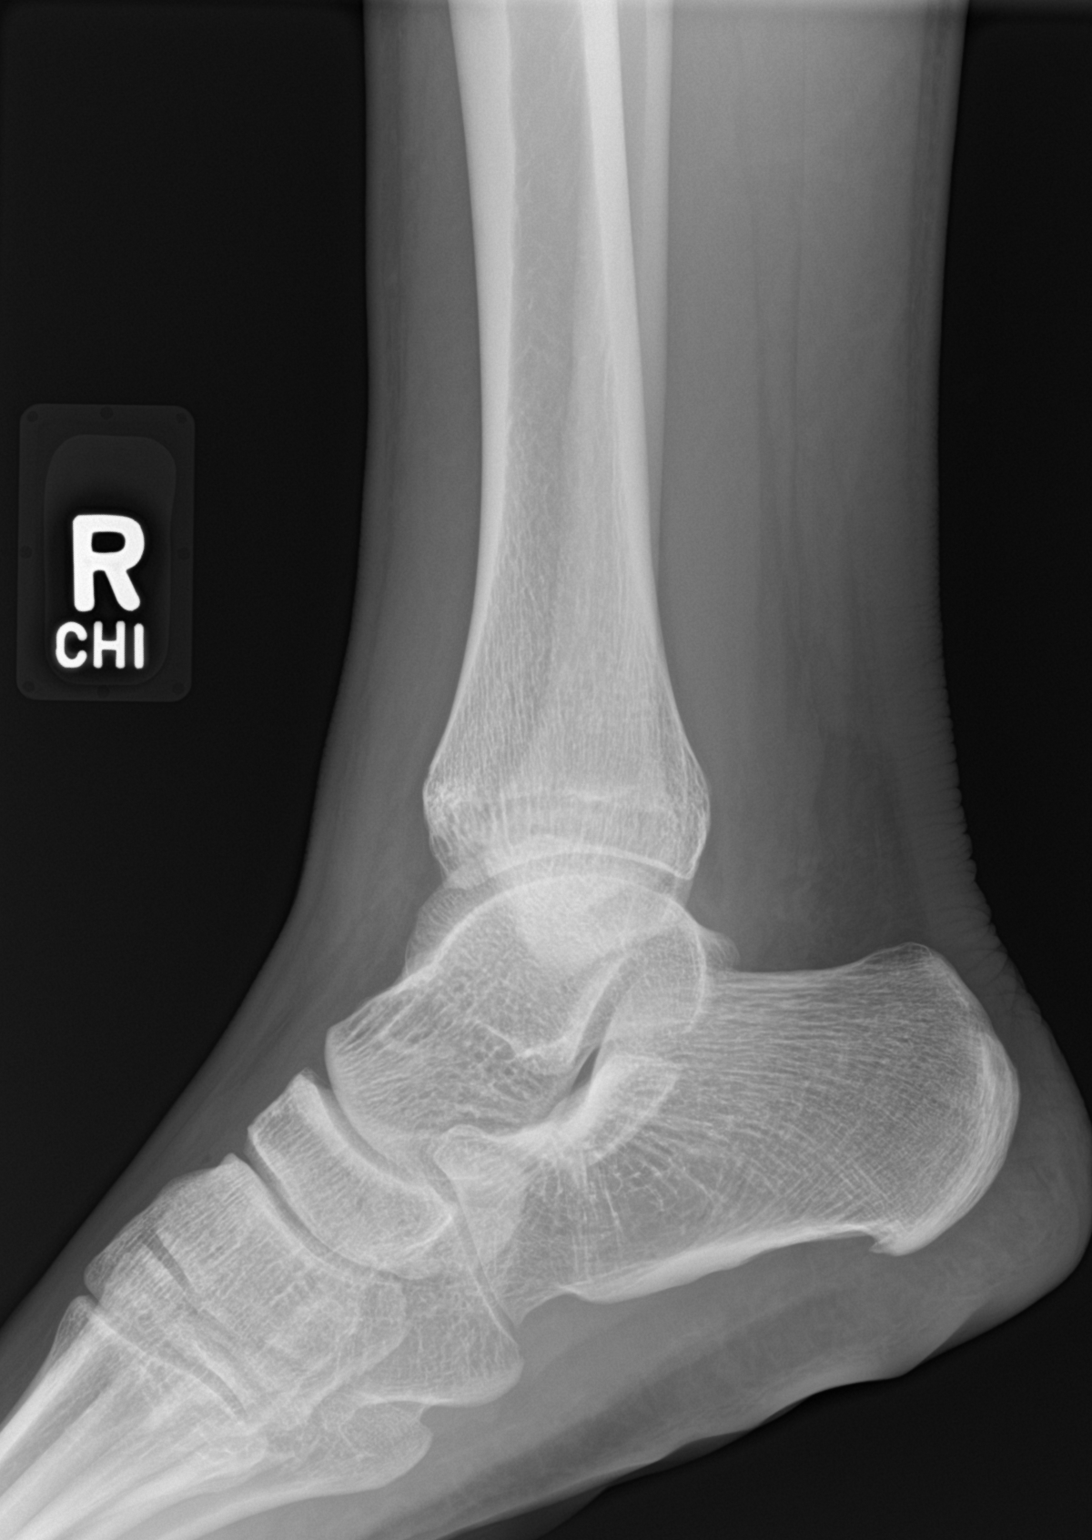

[3 of 3 positions shown; findings below may reference images not displayed]

FINDINGS: There is no evidence of fracture, dislocation, or joint effusion.
There is no evidence of arthropathy or other focal bone abnormality.
Soft tissues are unremarkable.
IMPRESSION: Negative.
